# Patient Record
Sex: Male | Born: 1970 | Race: Black or African American | Hispanic: No | Marital: Single | Smoking: Never smoker
Health system: Southern US, Community
[De-identification: ages and names within clinical notes are randomized; demographics above are authoritative.]

## PROBLEM LIST (undated history)

## (undated) DIAGNOSIS — N200 Calculus of kidney: Secondary | ICD-10-CM

## (undated) DIAGNOSIS — N529 Male erectile dysfunction, unspecified: Secondary | ICD-10-CM

## (undated) DIAGNOSIS — Z9989 Dependence on other enabling machines and devices: Secondary | ICD-10-CM

## (undated) DIAGNOSIS — Z9109 Other allergy status, other than to drugs and biological substances: Secondary | ICD-10-CM

## (undated) DIAGNOSIS — G4733 Obstructive sleep apnea (adult) (pediatric): Secondary | ICD-10-CM

## (undated) DIAGNOSIS — I10 Essential (primary) hypertension: Secondary | ICD-10-CM

## (undated) HISTORY — DX: Obstructive sleep apnea (adult) (pediatric): G47.33

## (undated) HISTORY — DX: Male erectile dysfunction, unspecified: N52.9

## (undated) HISTORY — DX: Obstructive sleep apnea (adult) (pediatric): Z99.89

## (undated) HISTORY — DX: Other allergy status, other than to drugs and biological substances: Z91.09

## (undated) HISTORY — DX: Calculus of kidney: N20.0

---

## 1998-07-24 ENCOUNTER — Inpatient Hospital Stay: Admission: EM | Admit: 1998-07-24 | Discharge: 1998-07-25 | Payer: Self-pay | Admitting: Emergency Medicine

## 1999-05-24 ENCOUNTER — Emergency Department (HOSPITAL_COMMUNITY): Admission: EM | Admit: 1999-05-24 | Discharge: 1999-05-24 | Payer: Self-pay | Admitting: Emergency Medicine

## 2004-02-25 ENCOUNTER — Emergency Department (HOSPITAL_COMMUNITY): Admission: AD | Admit: 2004-02-25 | Discharge: 2004-02-25 | Payer: Self-pay | Admitting: Family Medicine

## 2005-09-07 ENCOUNTER — Emergency Department (HOSPITAL_COMMUNITY): Admission: EM | Admit: 2005-09-07 | Discharge: 2005-09-07 | Payer: Self-pay | Admitting: Family Medicine

## 2006-03-21 ENCOUNTER — Emergency Department (HOSPITAL_COMMUNITY): Admission: EM | Admit: 2006-03-21 | Discharge: 2006-03-21 | Payer: Self-pay | Admitting: Family Medicine

## 2006-04-13 ENCOUNTER — Encounter: Payer: Self-pay | Admitting: Emergency Medicine

## 2011-06-23 ENCOUNTER — Other Ambulatory Visit: Payer: Self-pay | Admitting: Family Medicine

## 2011-06-23 DIAGNOSIS — I10 Essential (primary) hypertension: Secondary | ICD-10-CM

## 2011-06-28 ENCOUNTER — Ambulatory Visit
Admission: RE | Admit: 2011-06-28 | Discharge: 2011-06-28 | Disposition: A | Discharge status: Home / Self Care | Payer: BC Managed Care – PPO | Source: Ambulatory Visit | Attending: Family Medicine | Admitting: Family Medicine

## 2011-06-28 DIAGNOSIS — I10 Essential (primary) hypertension: Secondary | ICD-10-CM

## 2011-06-28 MED ORDER — GADOBENATE DIMEGLUMINE 529 MG/ML IV SOLN
15.0000 mL | Freq: Once | INTRAVENOUS | Status: AC | PRN
  Administered 2011-06-28: 15 mL via INTRAVENOUS

## 2014-01-02 ENCOUNTER — Other Ambulatory Visit: Payer: Self-pay | Admitting: Family Medicine

## 2014-01-02 ENCOUNTER — Ambulatory Visit
Admission: RE | Admit: 2014-01-02 | Discharge: 2014-01-02 | Disposition: A | Discharge status: Home / Self Care | Payer: BC Managed Care – PPO | Source: Ambulatory Visit | Attending: Family Medicine | Admitting: Family Medicine

## 2014-01-02 DIAGNOSIS — R059 Cough, unspecified: Secondary | ICD-10-CM

## 2014-01-02 DIAGNOSIS — R05 Cough: Secondary | ICD-10-CM

## 2014-01-02 DIAGNOSIS — R509 Fever, unspecified: Secondary | ICD-10-CM

## 2014-01-02 DIAGNOSIS — J189 Pneumonia, unspecified organism: Secondary | ICD-10-CM

## 2014-04-18 ENCOUNTER — Encounter (HOSPITAL_COMMUNITY): Payer: Self-pay | Admitting: Emergency Medicine

## 2014-04-18 ENCOUNTER — Emergency Department (HOSPITAL_COMMUNITY)
Admission: EM | Admit: 2014-04-18 | Discharge: 2014-04-19 | Disposition: A | Discharge status: Home / Self Care | Payer: BC Managed Care – PPO | Attending: Emergency Medicine | Admitting: Emergency Medicine

## 2014-04-18 ENCOUNTER — Emergency Department (HOSPITAL_COMMUNITY): Payer: BC Managed Care – PPO

## 2014-04-18 DIAGNOSIS — N200 Calculus of kidney: Secondary | ICD-10-CM

## 2014-04-18 DIAGNOSIS — I10 Essential (primary) hypertension: Secondary | ICD-10-CM | POA: Insufficient documentation

## 2014-04-18 HISTORY — DX: Essential (primary) hypertension: I10

## 2014-04-18 LAB — CBC WITH DIFFERENTIAL/PLATELET
Basophils Absolute: 0 10*3/uL (ref 0.0–0.1)
Basophils Relative: 1 % (ref 0–1)
Eosinophils Absolute: 0.5 10*3/uL (ref 0.0–0.7)
Eosinophils Relative: 6 % — ABNORMAL HIGH (ref 0–5)
HEMATOCRIT: 43.5 % (ref 39.0–52.0)
Hemoglobin: 14.2 g/dL (ref 13.0–17.0)
LYMPHS PCT: 20 % (ref 12–46)
Lymphs Abs: 1.6 10*3/uL (ref 0.7–4.0)
MCH: 28.2 pg (ref 26.0–34.0)
MCHC: 32.6 g/dL (ref 30.0–36.0)
MCV: 86.3 fL (ref 78.0–100.0)
MONO ABS: 0.5 10*3/uL (ref 0.1–1.0)
MONOS PCT: 6 % (ref 3–12)
Neutro Abs: 5.6 10*3/uL (ref 1.7–7.7)
Neutrophils Relative %: 68 % (ref 43–77)
Platelets: 269 10*3/uL (ref 150–400)
RBC: 5.04 MIL/uL (ref 4.22–5.81)
RDW: 14.2 % (ref 11.5–15.5)
WBC: 8.3 10*3/uL (ref 4.0–10.5)

## 2014-04-18 LAB — COMPREHENSIVE METABOLIC PANEL
ALT: 31 U/L (ref 0–53)
AST: 27 U/L (ref 0–37)
Albumin: 4.1 g/dL (ref 3.5–5.2)
Alkaline Phosphatase: 63 U/L (ref 39–117)
BUN: 14 mg/dL (ref 6–23)
CO2: 28 meq/L (ref 19–32)
Calcium: 9.5 mg/dL (ref 8.4–10.5)
Chloride: 100 mEq/L (ref 96–112)
Creatinine, Ser: 1.35 mg/dL (ref 0.50–1.35)
GFR, EST AFRICAN AMERICAN: 73 mL/min — AB (ref >90)
GFR, EST NON AFRICAN AMERICAN: 63 mL/min — AB (ref >90)
GLUCOSE: 112 mg/dL — AB (ref 70–99)
Potassium: 3.5 mEq/L — ABNORMAL LOW (ref 3.7–5.3)
Sodium: 141 mEq/L (ref 137–147)
Total Bilirubin: 0.3 mg/dL (ref 0.3–1.2)
Total Protein: 7.4 g/dL (ref 6.0–8.3)

## 2014-04-18 LAB — URINALYSIS, ROUTINE W REFLEX MICROSCOPIC
BILIRUBIN URINE: NEGATIVE
Glucose, UA: NEGATIVE mg/dL
Hgb urine dipstick: NEGATIVE
Ketones, ur: NEGATIVE mg/dL
Leukocytes, UA: NEGATIVE
NITRITE: NEGATIVE
Protein, ur: NEGATIVE mg/dL
Specific Gravity, Urine: 1.013 (ref 1.005–1.030)
UROBILINOGEN UA: 0.2 mg/dL (ref 0.0–1.0)
pH: 7 (ref 5.0–8.0)

## 2014-04-18 MED ORDER — OXYCODONE-ACETAMINOPHEN 5-325 MG PO TABS: 1.0000 | ORAL_TABLET | ORAL | Status: DC | PRN

## 2014-04-18 MED ORDER — HYDROMORPHONE HCL PF 1 MG/ML IJ SOLN
0.5000 mg | Freq: Once | INTRAMUSCULAR | Status: AC
  Administered 2014-04-18: 0.5 mg via INTRAVENOUS
  Filled 2014-04-18: qty 1

## 2014-04-18 MED ORDER — ONDANSETRON 8 MG PO TBDP: 8.0000 mg | ORAL_TABLET | Freq: Three times a day (TID) | ORAL | Status: DC | PRN

## 2014-04-18 MED ORDER — ONDANSETRON HCL 4 MG/2ML IJ SOLN
4.0000 mg | Freq: Once | INTRAMUSCULAR | Status: AC
  Administered 2014-04-18: 4 mg via INTRAVENOUS
  Filled 2014-04-18: qty 2

## 2014-04-18 MED ORDER — TAMSULOSIN HCL 0.4 MG PO CAPS
0.4000 mg | ORAL_CAPSULE | Freq: Once | ORAL | Status: AC
  Administered 2014-04-18: 0.4 mg via ORAL
  Filled 2014-04-18: qty 1

## 2014-04-18 MED ORDER — TAMSULOSIN HCL 0.4 MG PO CAPS: 0.4000 mg | ORAL_CAPSULE | Freq: Every day | ORAL | Status: DC

## 2014-04-18 MED ORDER — HYDROMORPHONE HCL PF 1 MG/ML IJ SOLN
0.5000 mg | Freq: Once | INTRAMUSCULAR | Status: AC
Start: 2014-04-18 — End: 2014-04-18
  Administered 2014-04-18: 0.5 mg via INTRAVENOUS
  Filled 2014-04-18: qty 1

## 2014-04-18 MED ORDER — OXYCODONE-ACETAMINOPHEN 5-325 MG PO TABS
1.0000 | ORAL_TABLET | Freq: Once | ORAL | Status: AC
  Administered 2014-04-18: 1 via ORAL
  Filled 2014-04-18: qty 1

## 2014-04-18 MED ORDER — KETOROLAC TROMETHAMINE 30 MG/ML IJ SOLN
30.0000 mg | Freq: Once | INTRAMUSCULAR | Status: AC
  Administered 2014-04-18: 30 mg via INTRAVENOUS
  Filled 2014-04-18: qty 1

## 2014-04-18 MED ORDER — IBUPROFEN 800 MG PO TABS: 800.0000 mg | ORAL_TABLET | Freq: Three times a day (TID) | ORAL | Status: AC

## 2014-04-18 NOTE — ED Notes (Signed)
Pt presented with left side flank pain that started about 6pm tonight.

## 2014-04-18 NOTE — ED Notes (Signed)
Pt went to bathroom and then had pain in left flank after urination. No difficulty urinating.

## 2014-04-18 NOTE — ED Provider Notes (Signed)
CSN: [id number]     Arrival date & time 04/18/14  1838 History   First MD Initiated Contact with Patient 04/18/14 2103     Chief Complaint  Patient presents with  . Flank Pain    left     (Consider location/radiation/quality/duration/timing/severity/associated sxs/prior Treatment) HPI Margaree Mackintoshhillip C Brendle is a 43 y.o. male who presents to ED with sudden onset of left flank pain. Pt states he urinated and about 20 min after started having sharp and stabbing flank pain that radiates into the left lower abdomen. Denies any urinary symptoms since. No changes in bowels. Admits to nausea and vomiting. States having sweats. Patient reports history of similar pain in the past, was diagnosed with kidney stones, states last kidney stone was over 20 years ago. He denies any fever. He denies any blood in his stool or emesis. He did not take any medications prior to coming in. Nothing makes pain better or worse.  Past Medical History  Diagnosis Date  . Hypertension    History reviewed. No pertinent past surgical history. No family history on file. History  Substance Use Topics  . Smoking status: Never Smoker   . Smokeless tobacco: Not on file  . Alcohol Use: No    Review of Systems  Constitutional: Negative for fever and chills.  Respiratory: Negative for cough, chest tightness and shortness of breath.   Cardiovascular: Negative for chest pain, palpitations and leg swelling.  Gastrointestinal: Positive for abdominal pain. Negative for nausea, vomiting, diarrhea and abdominal distention.  Genitourinary: Positive for flank pain. Negative for dysuria, urgency, frequency, hematuria, discharge, penile swelling, scrotal swelling and penile pain.  Musculoskeletal: Negative for arthralgias, myalgias, neck pain and neck stiffness.  Skin: Negative for rash.  Allergic/Immunologic: Negative for immunocompromised state.  Neurological: Negative for dizziness, weakness, light-headedness, numbness and headaches.       Allergies  Review of patient's allergies indicates no known allergies.  Home Medications   Prior to Admission medications   Not on File   BP 133/87  Pulse 72  Temp(Src) 98 F (36.7 C) (Oral)  Resp 21  SpO2 99% Physical Exam  Nursing note and vitals reviewed. Constitutional: He appears well-developed and well-nourished.  Appears to be in pain  HENT:  Head: Normocephalic and atraumatic.  Eyes: Conjunctivae are normal.  Neck: Neck supple.  Cardiovascular: Normal rate, regular rhythm and normal heart sounds.   Pulmonary/Chest: Effort normal. No respiratory distress. He has no wheezes. He has no rales.  Abdominal: Soft. Bowel sounds are normal. He exhibits no distension. There is no tenderness. There is no rebound and no guarding.  No CVA tenderness  Musculoskeletal: He exhibits no edema.  Neurological: He is alert.  Skin: Skin is warm and dry.    ED Course  Procedures (including critical care time) Labs Review Labs Reviewed  CBC WITH DIFFERENTIAL - Abnormal; Notable for the following:    Eosinophils Relative 6 (*)    All other components within normal limits  COMPREHENSIVE METABOLIC PANEL - Abnormal; Notable for the following:    Potassium 3.5 (*)    Glucose, Bld 112 (*)    GFR calc non Af Amer 63 (*)    GFR calc Af Amer 73 (*)    All other components within normal limits  URINALYSIS, ROUTINE W REFLEX MICROSCOPIC - Abnormal; Notable for the following:    APPearance CLOUDY (*)    All other components within normal limits    Imaging Review Ct Abdomen Pelvis Wo Contrast  04/18/2014  CLINICAL DATA:  Left flank pain after urination. Evaluate for renal stone.  EXAM: CT ABDOMEN AND PELVIS WITHOUT CONTRAST  TECHNIQUE: Multidetector CT imaging of the abdomen and pelvis was performed following the standard protocol without IV contrast.  COMPARISON:  None currently available  FINDINGS: BODY WALL: Unremarkable.  LOWER CHEST: Unremarkable.  ABDOMEN/PELVIS:  Liver: 2 sub cm  low densities in the right hepatic lobe are stable from 2012 MRI and considered benign.  Biliary: No evidence of biliary obstruction or stone.  Pancreas: Unremarkable.  Spleen: Unremarkable.  Adrenals: Unremarkable.  Kidneys and ureters: 4 mm stone at the left ureteral vesicular junction. Mild left hydronephrosis and perinephric stranding. No intrarenal left calculus. There is a punctate stone in the interpolar right kidney.  Bladder: Unremarkable.  Reproductive: Unremarkable.  Bowel: No obstruction. Normal appendix.  Retroperitoneum: No mass or adenopathy.  Peritoneum: No free fluid or gas.  Vascular: No acute abnormality.  OSSEOUS: No acute abnormalities. 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 There is degenerative disc change, most notable at L4-5 and L5-S1.  IMPRESSION: 1. 4 mm stone in the distal left ureter.  Mild left hydronephrosis. 2. Punctate right nephrolithiasis. 3. 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   Electronically Signed   By: Tiburcio PeaJonathan  Watts M.D.   On: 04/18/2014 22:08     EKG Interpretation None      MDM   Final diagnoses:  Kidney stone    Patient with sudden onset of left flank pain. Pain radiates into the left abdomen. Examined history concerning for a kidney stone. He has no abdominal tenderness or CVA tenderness on exam. He's afebrile. Labs and urinalysis pending. Will get CT abdomen pelvis for a stone study.  11:37 PM Pt's CT showing left 4mm stone in distal ureter. Feeling much better. Pain is 0/10. Home with pain medications - percocet, flomax, ibuprofen. Return if worsening. Follow up with urology. Plan discussed with pt and his wife, agree.   Filed Vitals:   04/18/14 1853 04/18/14 2239 04/18/14 2336 04/19/14 0023  BP: 133/87 107/63 129/79   Pulse: 72 71 80   Temp: 98 F (36.7 C)   98.3 F (36.8 C)  TempSrc: Oral   Oral  Resp: 21  16   SpO2: 99% 99% 93% 95%     Lottie Musselatyana A Brandolyn Shortridge, PA-C 04/19/14 0039

## 2014-04-18 NOTE — Discharge Instructions (Signed)
You have a left kidney stone. Start taking flomax daily. Take zofran as prescribed as needed for nausea. Take percocet as prescribed as needed for pain. Ibuprofen in addition for pain relief. Follow up with primary care doctor and urology.    Kidney Stones Kidney stones (urolithiasis) are deposits that form inside your kidneys. The intense pain is caused by the stone moving through the urinary tract. When the stone moves, the ureter goes into spasm around the stone. The stone is usually passed in the urine.  CAUSES   A disorder that makes certain neck glands produce too much parathyroid hormone (primary hyperparathyroidism).  A buildup of uric acid crystals, similar to gout in your joints.  Narrowing (stricture) of the ureter.  A kidney obstruction present at birth (congenital obstruction).  Previous surgery on the kidney or ureters.  Numerous kidney infections. SYMPTOMS   Feeling sick to your stomach (nauseous).  Throwing up (vomiting).  Blood in the urine (hematuria).  Pain that usually spreads (radiates) to the groin.  Frequency or urgency of urination. DIAGNOSIS   Taking a history and physical exam.  Blood or urine tests.  CT scan.  Occasionally, an examination of the inside of the urinary bladder (cystoscopy) is performed. TREATMENT   Observation.  Increasing your fluid intake.  Extracorporeal shock wave lithotripsy This is a noninvasive procedure that uses shock waves to break up kidney stones.  Surgery may be needed if you have severe pain or persistent obstruction. There are various surgical procedures. Most of the procedures are performed with the use of small instruments. Only small incisions are needed to accommodate these instruments, so recovery time is minimized. The size, location, and chemical composition are all important variables that will determine the proper choice of action for you. Talk to your health care provider to better understand your  situation so that you will minimize the risk of injury to yourself and your kidney.  HOME CARE INSTRUCTIONS   Drink enough water and fluids to keep your urine clear or pale yellow. This will help you to pass the stone or stone fragments.  Strain all urine through the provided strainer. Keep all particulate matter and stones for your health care provider to see. The stone causing the pain may be as small as a grain of salt. It is very important to use the strainer each and every time you pass your urine. The collection of your stone will allow your health care provider to analyze it and verify that a stone has actually passed. The stone analysis will often identify what you can do to reduce the incidence of recurrences.  Only take over-the-counter or prescription medicines for pain, discomfort, or fever as directed by your health care provider.  Make a follow-up appointment with your health care provider as directed.  Get follow-up X-rays if required. The absence of pain does not always mean that the stone has passed. It may have only stopped moving. If the urine remains completely obstructed, it can cause loss of kidney function or even complete destruction of the kidney. It is your responsibility to make sure X-rays and follow-ups are completed. Ultrasounds of the kidney can show blockages and the status of the kidney. Ultrasounds are not associated with any radiation and can be performed easily in a matter of minutes. SEEK MEDICAL CARE IF:  You experience pain that is progressive and unresponsive to any pain medicine you have been prescribed. SEEK IMMEDIATE MEDICAL CARE IF:   Pain cannot be controlled with the  prescribed medicine.  You have a fever or shaking chills.  The severity or intensity of pain increases over 18 hours and is not relieved by pain medicine.  You develop a new onset of abdominal pain.  You feel faint or pass out.  You are unable to urinate. MAKE SURE YOU:    Understand these instructions.  Will watch your condition.  Will get help right away if you are not doing well or get worse. Document Released: 11/29/2005 Document Revised: 08/01/2013 Document Reviewed: 05/02/2013 [facility] Patient Information 2014 [place], [place].  Diet for Kidney Stones Kidney stones are small, hard masses that form inside your kidneys. They are made up of salts and minerals and often form when high levels build up in the urine. The minerals can then start to build up, crystalize, and stick together to form stones. There are several different types of kidney stones. The following types of stones may be influenced by dietary factors:   Calcium Oxalate Stones. An oxalate is a salt found in certain foods. Within the body, calcium can combine with oxalates to form calcium oxalate stones, which can be excreted in the urine in high amounts. This is the most common type of kidney stone.  Calcium Phosphate Stones. These stones may occur when the pH of the urine becomes too high, or less acidic, from too much calcium being excreted in the urine. The pH is a measure of how acidic or basic a substance is.  Uric Acid Stones. This type of stone occurs when the pH of the urine becomes too low, or very acidic, because substances called purines build up in the urine. Purines are found in animal proteins. When the urine is highly concentrated with acid, uric acid kidney stones can form.  Other risk factors for kidney stones include genetics, environment, and being overweight. Your caregiver may ask you to follow specific diet guidelines based on the type of stone you have to lessen the chances of your body making more kidney stones.  GENERAL GUIDELINES FOR ALL TYPES OF STONES  Drink plenty of fluid. Drink 12 16 cups of fluid a day, drinking mainly water.This is the most important thing you can do to prevent the formation of future kidney stones.  Maintain a healthy weight. Your caregiver  or dietitian can help you determine what a healthy weight is for you. If you are overweight, weight loss may help prevent the formation of future kidney stones.  Eat a diet adequate in animal protein. Too much animal protein can contribute to the formation of stones. Your dietitian can help you determine how much protein you should be eating. Avoid low carbohydrate, high protein diets.  Follow a balanced eating approach. The DASH diet, which stands for "Dietary Approaches to Stop Hypertension," is an effective meal plan for reducing stone formation. This diet is high in fruits, vegetables, dairy, and whole grains and low in animal protein. Ask your caregiver or dietitian for information about the DASH diet. ADDITIONAL DIET GUIDELINES FOR CALCIUM STONES Avoid foods high in salt. This includes table salt, salt seasonings, MSG, soy sauce, cured and processed meats, salted crackers and snack foods, fast food, and canned soups and foods. Ask your caregiver or dietitian for information about reducing sodium in your diet or following the low sodium diet.  Ensure adequate calcium intake. Use the following table for calcium guidelines:  Men 42 years old and younger  1000 mg/day.  Men 42 years old and older  1500 mg/day.  Women 25  42 years old  1000 mg/day.  Women 50 years and older  1500 mg/day. Your dietitian can help you determine if you are getting enough calcium in your diet. Foods that are high in calcium include dairy products, broccoli, cheese, yogurt, and pudding. If you need to take a calcium supplement, take it only in the form of calcium citrate.  Avoid foods high in oxalate. Be sure that any supplements you take do not contain more than 500 mg of vitamin C. Vitamin C is converted into oxalate in the body. You do not need to avoid fruits and vegetables high in vitamin C.   Grains: High-fiber or bran cereal, whole-wheat bread, grits, barley, buckwheat, amaranth, pretzels, and  fruitcake.  Vegetables: Dried beans, wax beans, dark leafy greens, eggplant, leeks, okra, parsley, rutabaga, tomato paste, watercress, zucchini, and escarole.  Fruit: Dried apricots, red currants, figs, kiwi, and rhubarb.  Meat and Meat Substitutes: Soybeans and foods made from soy (soyburger, miso), dried beans, peanut butter.  Milk: Chocolate milk mixes and soymilk.  Fats and Oils: Nuts (peanuts, almonds, pecans, cashews, hazelnuts) and nut butters, sesame seeds, and tDahini paste.  Condiments/Miscellaneous: Chocolate, carob, marmalade, poppy seeds, instant iced tea, and juice from high-oxalate fruits.  Document Released: 03/26/2011 Document Revised: 05/30/2012 Document Reviewed: 05/15/2012 [facility] Patient Information 2014 [place], [place].

## 2014-04-20 NOTE — ED Provider Notes (Signed)
Medical screening examination/treatment/procedure(s) were performed by non-physician practitioner and as supervising physician I was immediately available for consultation/collaboration.  Jennene Downie L Bucky Grigg, MD 04/20/14 1509 

## 2015-03-23 IMAGING — CT CT ABD-PELV W/O CM
1 series · 15 of 30 positions shown, 19 images · non-contrast
Comparison: None currently available

CLINICAL DATA: Left flank pain after urination. Evaluate for renal
stone.

EXAM:
CT ABDOMEN AND PELVIS WITHOUT CONTRAST
TECHNIQUE: Multidetector CT imaging of the abdomen and pelvis was performed
following the standard protocol without IV contrast.

[Series 6: lung · axial · 0.74mm/px · z∈[+1075,+1205]mm · 15 of 30 slices shown, 19 images]
[im 3/30  soft-tissue]
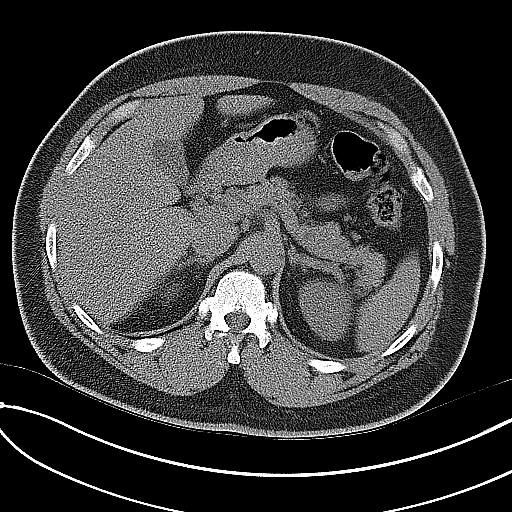
[im 3/30  bone]
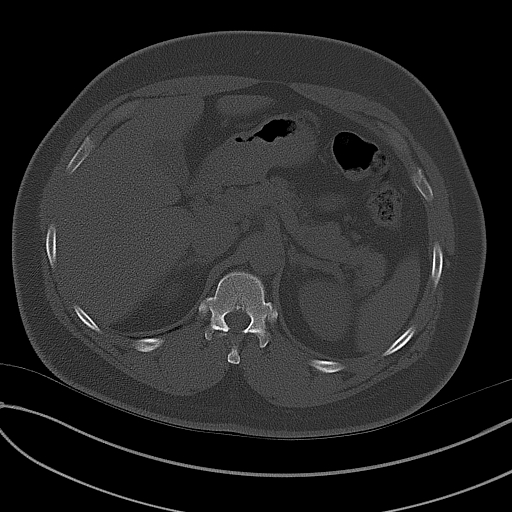
[im 5/30  soft-tissue]
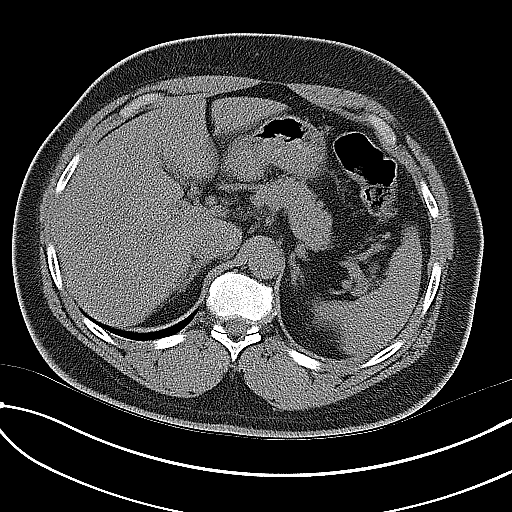
[im 7/30  soft-tissue]
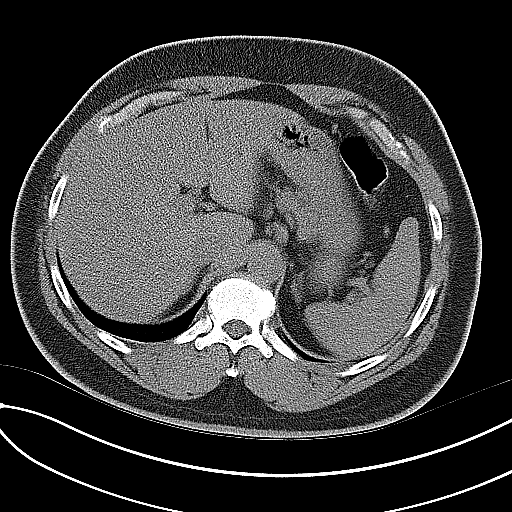
[im 9/30  soft-tissue]
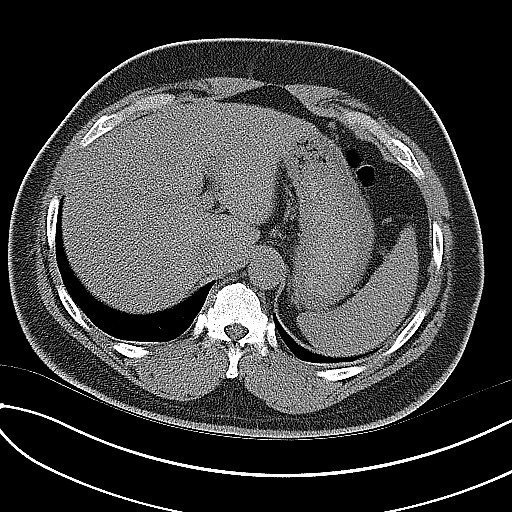
[im 11/30  soft-tissue]
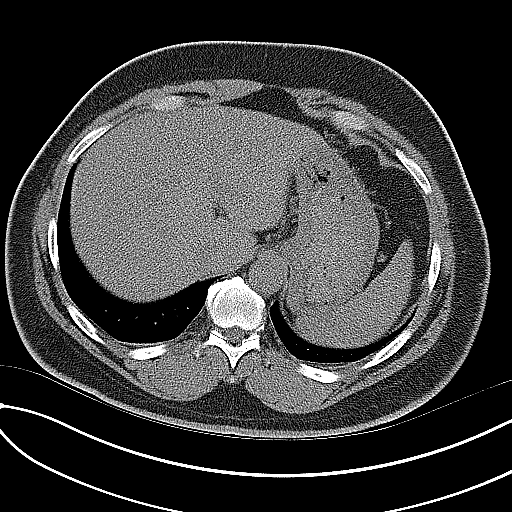
[im 13/30  soft-tissue]
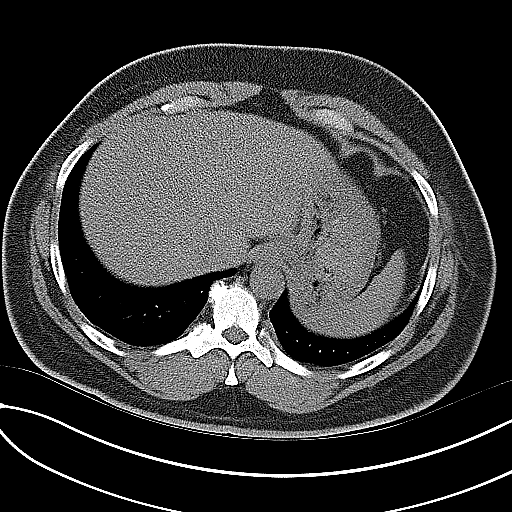
[im 16/30  soft-tissue]
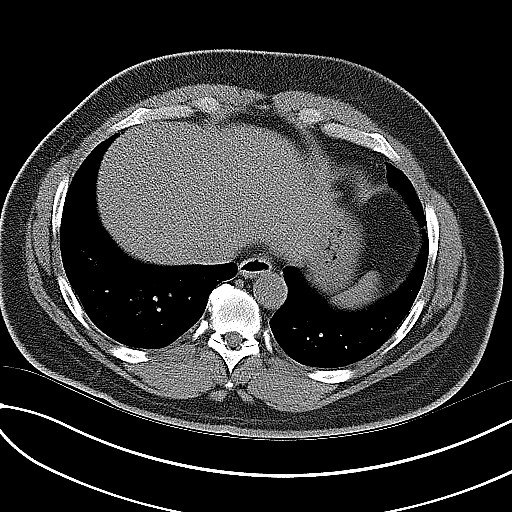
[im 18/30  soft-tissue]
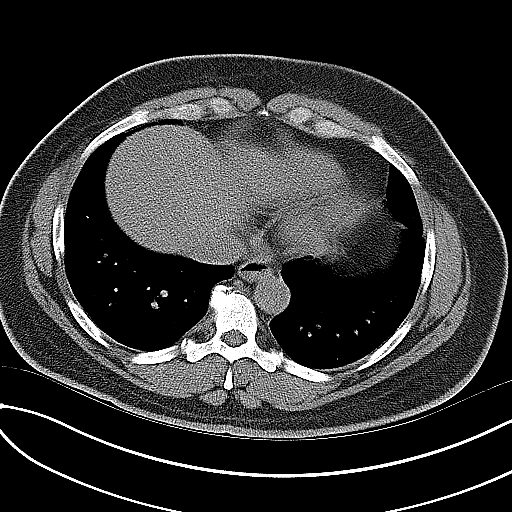
[im 20/30  soft-tissue]
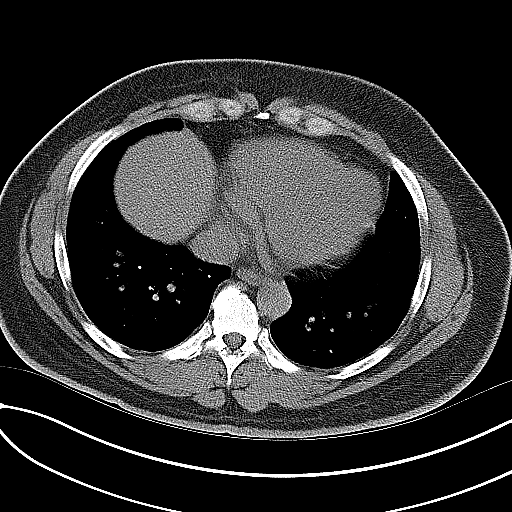
[im 20/30  bone]
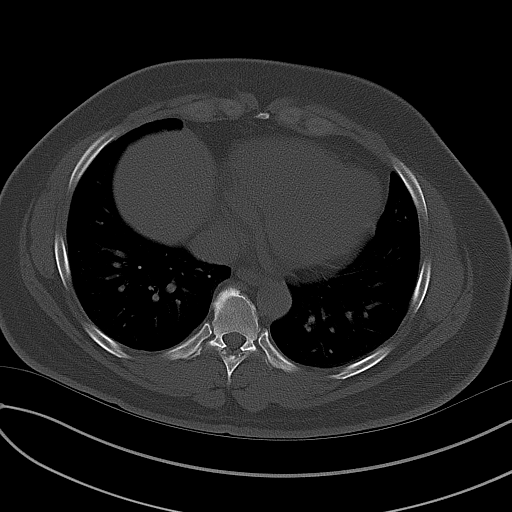
[im 22/30  soft-tissue]
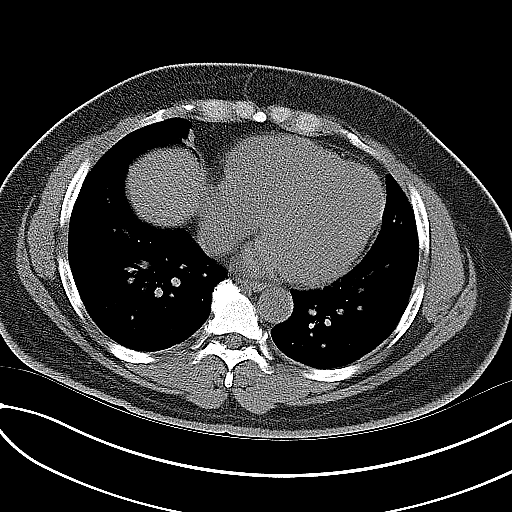
[im 24/30  soft-tissue]
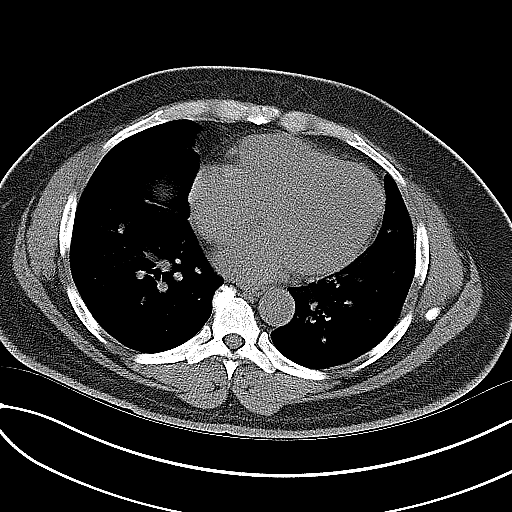
[im 26/30  soft-tissue]
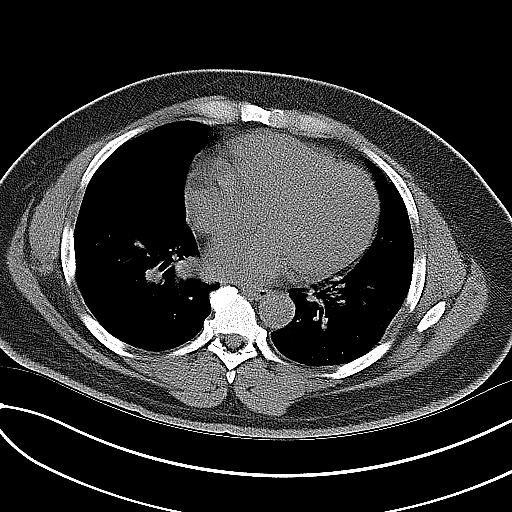
[im 26/30  lung]
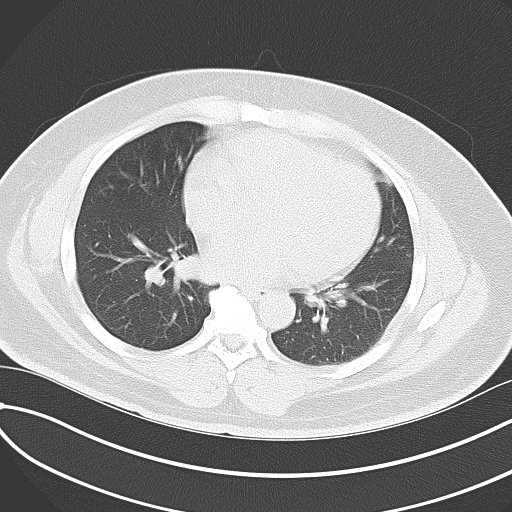
[im 27/30  lung]
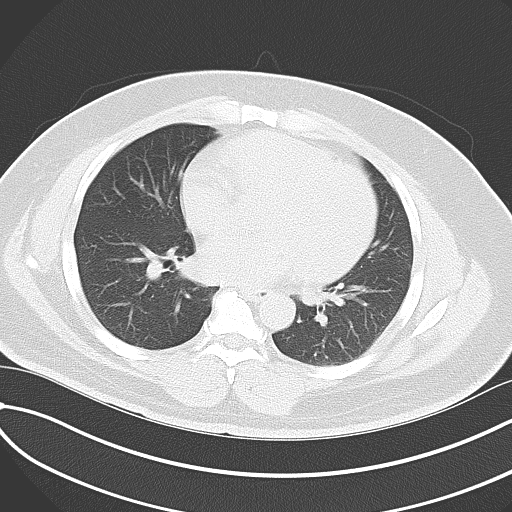
[im 28/30  soft-tissue]
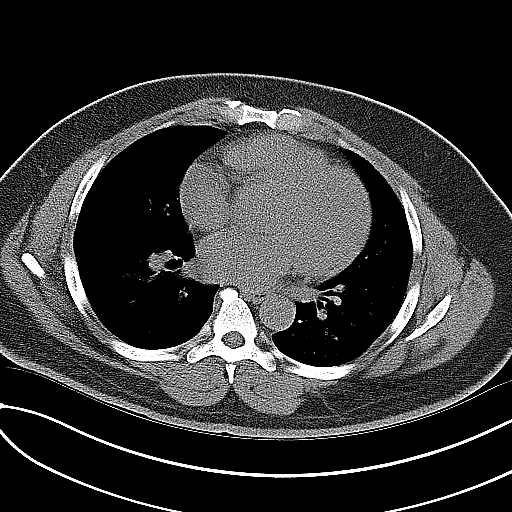
[im 28/30  lung]
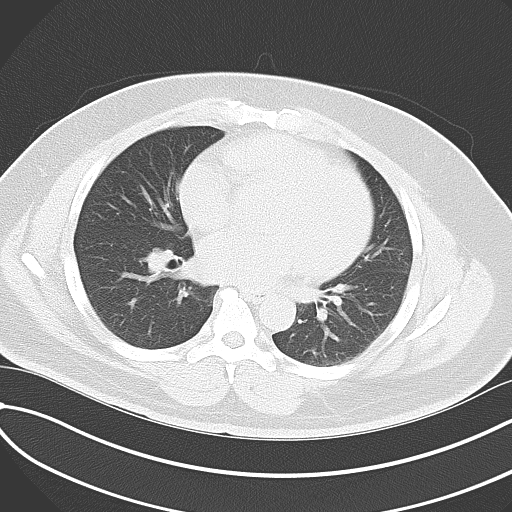
[im 29/30  lung]
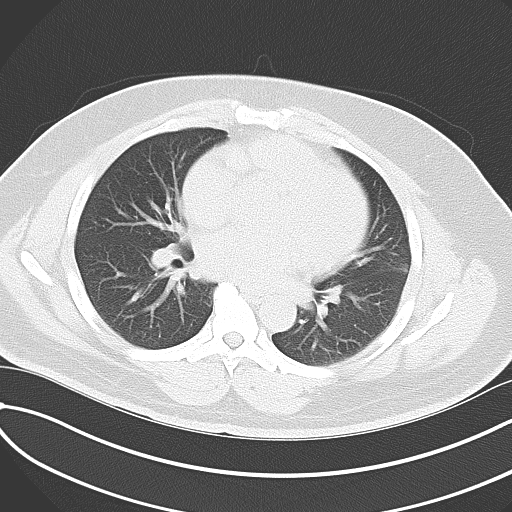

[15 of 30 positions shown; findings below may reference images not displayed]

FINDINGS: BODY WALL: Unremarkable.

LOWER CHEST: Unremarkable.

ABDOMEN/PELVIS:

Liver: 2 sub cm low densities in the right hepatic lobe are stable
from 2272 MRI and considered benign.

Biliary: No evidence of biliary obstruction or stone.

Pancreas: Unremarkable.

Spleen: Unremarkable.

Adrenals: Unremarkable.

Kidneys and ureters: 4 mm stone at the left ureteral vesicular
junction. Mild left hydronephrosis and perinephric stranding. No
intrarenal left calculus. There is a punctate stone in the
interpolar right kidney.

Bladder: Unremarkable.

Reproductive: Unremarkable.

Bowel: No obstruction. Normal appendix.

Retroperitoneum: No mass or adenopathy.

Peritoneum: No free fluid or gas.

Vascular: No acute abnormality.

OSSEOUS: No acute abnormalities. Bilateral L4 pars defects without
slip. There is degenerative disc change, most notable at L4-5 and
L5-S1.
IMPRESSION: 1. 4 mm stone in the distal left ureter.  Mild left hydronephrosis.
2. Punctate right nephrolithiasis.
3. L4 pars defects without listhesis.

## 2016-10-06 DIAGNOSIS — Z0000 Encounter for general adult medical examination without abnormal findings: Secondary | ICD-10-CM | POA: Diagnosis not present

## 2016-10-06 DIAGNOSIS — N529 Male erectile dysfunction, unspecified: Secondary | ICD-10-CM | POA: Diagnosis not present

## 2016-10-06 DIAGNOSIS — I10 Essential (primary) hypertension: Secondary | ICD-10-CM | POA: Diagnosis not present

## 2016-10-06 DIAGNOSIS — Z23 Encounter for immunization: Secondary | ICD-10-CM | POA: Diagnosis not present

## 2016-10-06 DIAGNOSIS — G4733 Obstructive sleep apnea (adult) (pediatric): Secondary | ICD-10-CM | POA: Diagnosis not present

## 2016-10-06 DIAGNOSIS — Z125 Encounter for screening for malignant neoplasm of prostate: Secondary | ICD-10-CM | POA: Diagnosis not present

## 2017-11-15 DIAGNOSIS — I10 Essential (primary) hypertension: Secondary | ICD-10-CM | POA: Diagnosis not present

## 2017-11-15 DIAGNOSIS — G4733 Obstructive sleep apnea (adult) (pediatric): Secondary | ICD-10-CM | POA: Diagnosis not present

## 2017-11-15 DIAGNOSIS — N529 Male erectile dysfunction, unspecified: Secondary | ICD-10-CM | POA: Diagnosis not present

## 2017-11-15 DIAGNOSIS — Z23 Encounter for immunization: Secondary | ICD-10-CM | POA: Diagnosis not present

## 2017-11-15 DIAGNOSIS — Z0000 Encounter for general adult medical examination without abnormal findings: Secondary | ICD-10-CM | POA: Diagnosis not present

## 2017-12-09 DIAGNOSIS — E876 Hypokalemia: Secondary | ICD-10-CM | POA: Diagnosis not present

## 2018-01-13 DIAGNOSIS — I10 Essential (primary) hypertension: Secondary | ICD-10-CM | POA: Diagnosis not present

## 2018-04-11 DIAGNOSIS — G4733 Obstructive sleep apnea (adult) (pediatric): Secondary | ICD-10-CM | POA: Diagnosis not present

## 2018-04-11 DIAGNOSIS — I10 Essential (primary) hypertension: Secondary | ICD-10-CM | POA: Diagnosis not present

## 2018-06-30 ENCOUNTER — Ambulatory Visit: Payer: BLUE CROSS/BLUE SHIELD | Admitting: Cardiology

## 2018-06-30 VITALS — BP 138/82 | HR 69 | Ht 67.0 in | Wt 247.0 lb

## 2018-06-30 DIAGNOSIS — I10 Essential (primary) hypertension: Secondary | ICD-10-CM | POA: Diagnosis not present

## 2018-06-30 DIAGNOSIS — G4733 Obstructive sleep apnea (adult) (pediatric): Secondary | ICD-10-CM

## 2018-06-30 MED ORDER — IRBESARTAN 300 MG PO TABS: 300.0000 mg | ORAL_TABLET | Freq: Every day | ORAL | 3 refills | Status: AC

## 2018-06-30 MED ORDER — NEBIVOLOL HCL 5 MG PO TABS: 5.0000 mg | ORAL_TABLET | Freq: Every day | ORAL | 11 refills | Status: DC

## 2018-06-30 NOTE — Progress Notes (Signed)
Cardiology Office Note:    Date:  06/30/2018   ID:  Willie Contreras, DOB 1971/08/14, MRN [id number]  PCP:  Blair HeysEhinger, Robert, MD  Cardiologist:  No primary care provider on file.   Referring MD: Blair HeysEhinger, Robert, MD     History of Present Illness:    Willie Contreras is a 47 y.o. male here for evaluation of uncontrolled hypertension at the request of Dr. Manus GunningEhinger.  He had MRA of his renal arteries back in July 2012 which was negative for renal artery stenosis when his hypertension was previously first dealt with.  Blood pressure is not controlled with amlodipine 10 mg a day, irbesartan 300 mg a day.  He has had side effects to lisinopril-cough, atenolol-fatigue, triamterene hydrochlorothiazide- "hot and tired ", spironolactone-ED.  Some blood pressures at work have been 158/100.  He is trying to exercise and watch his diet but his weight still remains elevated with a BMI of 36.  Overall he is asymptomatic no shortness of breath, no chest pain, no fevers, no chills, no bleeding, no syncope, no headaches.  His wife takes his blood pressure with a large cuff with stethoscope.      Past Medical History:  Diagnosis Date  . ED (erectile dysfunction)   . Environmental allergies   . Hypertension   . Kidney stones   . OSA on CPAP     History reviewed. No pertinent surgical history.  Current Medications: Current Meds  Medication Sig  . amLODipine (NORVASC) 10 MG tablet Take 10 mg by mouth every morning.  . cetirizine (ZYRTEC) 10 MG tablet Take 10 mg by mouth daily as needed for allergies.  . fexofenadine (ALLEGRA) 180 MG tablet Take 180 mg by mouth daily as needed for allergies or rhinitis.  Marland Kitchen. ibuprofen (ADVIL,MOTRIN) 800 MG tablet Take 1 tablet (800 mg total) by mouth 3 (three) times daily.  . irbesartan (AVAPRO) 300 MG tablet Take 1 tablet (300 mg total) by mouth daily.  . [DISCONTINUED] irbesartan (AVAPRO) 300 MG tablet Take 300 mg by mouth daily.     Allergies:   Atenolol;  Lisinopril; and Triamterene   Social History   Socioeconomic History  . Marital status: Single    Spouse name: Not on file  . Number of children: Not on file  . Years of education: Not on file  . Highest education level: Not on file  Occupational History  . Not on file  Social Needs  . Financial resource strain: Not on file  . Food insecurity:    Worry: Not on file    Inability: Not on file  . Transportation needs:    Medical: Not on file    Non-medical: Not on file  Tobacco Use  . Smoking status: Never Smoker  . Smokeless tobacco: Never Used  Substance and Sexual Activity  . Alcohol use: No  . Drug use: Not on file  . Sexual activity: Not on file    Comment: married  Lifestyle  . Physical activity:    Days per week: Not on file    Minutes per session: Not on file  . Stress: Not on file  Relationships  . Social connections:    Talks on phone: Not on file    Gets together: Not on file    Attends religious service: Not on file    Active member of club or organization: Not on file    Attends meetings of clubs or organizations: Not on file    Relationship status: Not on  file  Other Topics Concern  . Not on file  Social History Narrative  . Not on file     Family History: Mother died in her late 66s from gastric cancer.  ROS:   Please see the history of present illness.     All other systems reviewed and are negative.  EKGs/Labs/Other Studies Reviewed:    The following studies were reviewed today: Lab work, EKGs, prior office notes reviewed  EKG:  EKG is ordered today.  06/30/2018-sinus rhythm 69, borderline LVH personally viewed  Recent Labs: No results found for requested labs within last 8760 hours.  Recent Lipid Panel No results found for: CHOL, TRIG, HDL, CHOLHDL, VLDL, LDLCALC, LDLDIRECT  Physical Exam:    VS:  BP 138/82   Pulse 69   Ht 5\' 7"  (1.702 m)   Wt 247 lb (112 kg)   SpO2 97%   BMI 38.69 kg/m     Wt Readings from Last 3 Encounters:    06/30/18 247 lb (112 kg)     GEN:  Well nourished, well developed in no acute distress HEENT: Normal NECK: No JVD; No carotid bruits LYMPHATICS: No lymphadenopathy CARDIAC: RRR, no murmurs, rubs, gallops RESPIRATORY:  Clear to auscultation without rales, wheezing or rhonchi  ABDOMEN: Soft, non-tender, non-distended MUSCULOSKELETAL:  No edema; No deformity  SKIN: Warm and dry NEUROLOGIC:  Alert and oriented x 3 PSYCHIATRIC:  Normal affect   ASSESSMENT:    1. Essential hypertension   2. OSA (obstructive sleep apnea)    PLAN:    In order of problems listed above:  Uncontrolled hypertension - We will try Bystolic 5 mg once a day.  We will give him samples.  This will need to be preauthorized.  He has failed beta-blocker previously, atenolol.  He has been on multiple medications without success.  Continue with his atenolol and irbesartan.  Blood in the past with diuretic, chlorthalidone with hypokalemia.  He is also had trouble with spironolactone-ED. he is fairly certain that he has had a TSH checked in the past.  Obstructive sleep apnea -continue with CPAP.  I will have him check his blood pressures, please relay readings in 1 month.  I will also have him follow-up with the hypertension clinic in 2 months.  Medication Adjustments/Labs and Tests Ordered: Current medicines are reviewed at length with the patient today.  Concerns regarding medicines are outlined above.  Orders Placed This Encounter  Procedures  . EKG 12-Lead   Meds ordered this encounter  Medications  . nebivolol (BYSTOLIC) 5 MG tablet    Sig: Take 1 tablet (5 mg total) by mouth daily.    Dispense:  30 tablet    Refill:  11  . irbesartan (AVAPRO) 300 MG tablet    Sig: Take 1 tablet (300 mg total) by mouth daily.    Dispense:  90 tablet    Refill:  3    Patient Instructions  Medication Instructions:  Start Bystolic 5 mg a day. Continue other medications as listed.  Follow-Up: Follow up in 2 months  in the Hypertension Clinic.  Please keep a check of your blood pressures and notify Dr Anne Fu of them in 1 month.  If you need a refill on your cardiac medications before your next appointment, please call your pharmacy.  Thank you for choosing [facility]!!        Signed, Donato Schultz, MD  06/30/2018 3:12 PM    [facility]

## 2018-06-30 NOTE — Patient Instructions (Signed)
Medication Instructions:  Start Bystolic 5 mg a day. Continue other medications as listed.  Follow-Up: Follow up in 2 months in the Hypertension Clinic.  Please keep a check of your blood pressures and notify Dr Anne FuSkains of them in 1 month.  If you need a refill on your cardiac medications before your next appointment, please call your pharmacy.  Thank you for choosing [facility]!!

## 2018-07-07 ENCOUNTER — Other Ambulatory Visit: Payer: Self-pay

## 2018-07-07 ENCOUNTER — Telehealth: Payer: Self-pay

## 2018-07-07 MED ORDER — NEBIVOLOL HCL 5 MG PO TABS: 5.0000 mg | ORAL_TABLET | Freq: Every day | ORAL | 11 refills | Status: AC

## 2018-07-07 NOTE — Telephone Encounter (Signed)
**Note De-Identified Kaliegh Willadsen Obfuscation** I have done a Bystolic PA through covermymeds. Key: [id number]

## 2018-08-31 ENCOUNTER — Ambulatory Visit: Payer: BLUE CROSS/BLUE SHIELD

## 2018-09-19 ENCOUNTER — Ambulatory Visit: Payer: BLUE CROSS/BLUE SHIELD | Admitting: Pharmacist

## 2018-09-19 NOTE — Progress Notes (Deleted)
Patient ID: Willie Contreras                 DOB: 11/05/1971                      MRN: [id number]     HPI: Willie Contreras is a 47 y.o. male referred by Dr. Anne Fu to HTN clinic. PMH is significant for uncontrolled HTN, obesity, ED, and OSA on CPAP. He was referred by PCP to cardiology for BP management and was started on Bystolic 5mg  daily at office visit in July 2019.   Can increase bystolic Using ibuprofen? Has high dose TID listed Try eplerenone BMET stable  Current HTN meds: amlodipine 10mg  daily, irbesartan 300mg  daily, Bystolic 58m daily  Previously tried: lisinopril - cough, atenolol - fatigue, triamterene-HCTZ - "hot and tired," spironolactone - ED, chlorthalidone - hypokalemia  BP goal: <130/17mmHg  Family History: Non contributory.  Social History: Denies tobacco use, alcohol, and illicit drug use.  Diet:   Exercise:   Home BP readings:   Wt Readings from Last 3 Encounters:  06/30/18 247 lb (112 kg)   BP Readings from Last 3 Encounters:  06/30/18 138/82  04/18/14 129/79   Pulse Readings from Last 3 Encounters:  06/30/18 69  04/18/14 80    Renal function: CrCl cannot be calculated (Patient's most recent lab result is older than the maximum 21 days allowed.).  Past Medical History:  Diagnosis Date  . ED (erectile dysfunction)   . Environmental allergies   . Hypertension   . Kidney stones   . OSA on CPAP     Current Outpatient Medications on File Prior to Visit  Medication Sig Dispense Refill  . amLODipine (NORVASC) 10 MG tablet Take 10 mg by mouth every morning.    . cetirizine (ZYRTEC) 10 MG tablet Take 10 mg by mouth daily as needed for allergies.    . fexofenadine (ALLEGRA) 180 MG tablet Take 180 mg by mouth daily as needed for allergies or rhinitis.    Marland Kitchen ibuprofen (ADVIL,MOTRIN) 800 MG tablet Take 1 tablet (800 mg total) by mouth 3 (three) times daily. 21 tablet 0  . irbesartan (AVAPRO) 300 MG tablet Take 1 tablet (300 mg total) by mouth daily.  90 tablet 3  . nebivolol (BYSTOLIC) 5 MG tablet Take 1 tablet (5 mg total) by mouth daily. 30 tablet 11   No current facility-administered medications on file prior to visit.     Allergies  Allergen Reactions  . Atenolol Other (See Comments)    fatigue  . Lisinopril Cough  . Triamterene Other (See Comments)    Hot and tired     Assessment/Plan:  1. Hypertension -

## 2018-12-18 DIAGNOSIS — Z0000 Encounter for general adult medical examination without abnormal findings: Secondary | ICD-10-CM | POA: Diagnosis not present

## 2018-12-18 DIAGNOSIS — I10 Essential (primary) hypertension: Secondary | ICD-10-CM | POA: Diagnosis not present

## 2018-12-18 DIAGNOSIS — Z23 Encounter for immunization: Secondary | ICD-10-CM | POA: Diagnosis not present

## 2018-12-18 DIAGNOSIS — R7309 Other abnormal glucose: Secondary | ICD-10-CM | POA: Diagnosis not present

## 2020-02-21 ENCOUNTER — Ambulatory Visit: Payer: Self-pay | Attending: Internal Medicine

## 2020-02-21 DIAGNOSIS — Z23 Encounter for immunization: Secondary | ICD-10-CM

## 2020-02-21 NOTE — Progress Notes (Signed)
   Covid-19 Vaccination Clinic  Name:  Willie Contreras    MRN: [id number] DOB: 07/01/1971  02/21/2020  Willie Contreras was observed post Covid-19 immunization for 15 minutes without incident. He was provided with Vaccine Information Sheet and instruction to access the V-Safe system.   Willie Contreras was instructed to call 911 with any severe reactions post vaccine: Marland Kitchen Difficulty breathing  . Swelling of face and throat  . A fast heartbeat  . A bad rash all over body  . Dizziness and weakness   Immunizations Administered    Name Date Dose VIS Date Route   Pfizer COVID-19 Vaccine 02/21/2020  4:35 PM 0.3 mL 11/23/2019 Intramuscular   Manufacturer: [employer], [employer]   Lot: [id number]   NDC: [address]-3

## 2020-03-17 ENCOUNTER — Ambulatory Visit: Payer: Self-pay | Attending: Internal Medicine

## 2020-03-17 DIAGNOSIS — Z23 Encounter for immunization: Secondary | ICD-10-CM

## 2020-03-17 NOTE — Progress Notes (Signed)
   Covid-19 Vaccination Clinic  Name:  Willie Contreras    MRN: [id number] DOB: August 13, 1971  03/17/2020  [name] was observed post Covid-19 immunization for 15 minutes without incident. He was provided with Vaccine Information Sheet and instruction to access the V-Safe system.   [name] was instructed to call 911 with any severe reactions post vaccine: Marland Kitchen Difficulty breathing  . Swelling of face and throat  . A fast heartbeat  . A bad rash all over body  . Dizziness and weakness   Immunizations Administered    Name Date Dose VIS Date Route   Pfizer COVID-19 Vaccine 03/17/2020  4:21 PM 0.3 mL 11/23/2019 Intramuscular   Manufacturer: [employer], [employer]   Lot: [id number]   NDC: [address]-2

## 2020-07-17 DIAGNOSIS — G4733 Obstructive sleep apnea (adult) (pediatric): Secondary | ICD-10-CM | POA: Diagnosis not present

## 2020-07-17 DIAGNOSIS — R7303 Prediabetes: Secondary | ICD-10-CM | POA: Diagnosis not present

## 2020-07-17 DIAGNOSIS — I10 Essential (primary) hypertension: Secondary | ICD-10-CM | POA: Diagnosis not present

## 2020-11-05 DIAGNOSIS — Z23 Encounter for immunization: Secondary | ICD-10-CM | POA: Diagnosis not present

## 2021-01-09 DIAGNOSIS — R7303 Prediabetes: Secondary | ICD-10-CM | POA: Diagnosis not present

## 2021-01-09 DIAGNOSIS — Z13220 Encounter for screening for lipoid disorders: Secondary | ICD-10-CM | POA: Diagnosis not present

## 2021-01-09 DIAGNOSIS — Z0000 Encounter for general adult medical examination without abnormal findings: Secondary | ICD-10-CM | POA: Diagnosis not present

## 2021-01-09 DIAGNOSIS — E876 Hypokalemia: Secondary | ICD-10-CM | POA: Diagnosis not present

## 2021-01-09 DIAGNOSIS — I10 Essential (primary) hypertension: Secondary | ICD-10-CM | POA: Diagnosis not present

## 2021-02-03 DIAGNOSIS — E876 Hypokalemia: Secondary | ICD-10-CM | POA: Diagnosis not present

## 2021-03-24 DIAGNOSIS — G4733 Obstructive sleep apnea (adult) (pediatric): Secondary | ICD-10-CM | POA: Diagnosis not present

## 2021-03-24 DIAGNOSIS — I10 Essential (primary) hypertension: Secondary | ICD-10-CM | POA: Diagnosis not present

## 2021-03-24 DIAGNOSIS — E669 Obesity, unspecified: Secondary | ICD-10-CM | POA: Diagnosis not present

## 2021-04-06 DIAGNOSIS — G4733 Obstructive sleep apnea (adult) (pediatric): Secondary | ICD-10-CM | POA: Diagnosis not present

## 2021-04-15 DIAGNOSIS — M79601 Pain in right arm: Secondary | ICD-10-CM | POA: Diagnosis not present

## 2021-05-06 DIAGNOSIS — G4733 Obstructive sleep apnea (adult) (pediatric): Secondary | ICD-10-CM | POA: Diagnosis not present

## 2021-05-14 DIAGNOSIS — Z1211 Encounter for screening for malignant neoplasm of colon: Secondary | ICD-10-CM | POA: Diagnosis not present

## 2021-06-06 DIAGNOSIS — G4733 Obstructive sleep apnea (adult) (pediatric): Secondary | ICD-10-CM | POA: Diagnosis not present

## 2021-06-18 DIAGNOSIS — G4733 Obstructive sleep apnea (adult) (pediatric): Secondary | ICD-10-CM | POA: Diagnosis not present

## 2021-06-18 DIAGNOSIS — I10 Essential (primary) hypertension: Secondary | ICD-10-CM | POA: Diagnosis not present

## 2021-07-02 DIAGNOSIS — M25511 Pain in right shoulder: Secondary | ICD-10-CM | POA: Diagnosis not present

## 2021-07-02 DIAGNOSIS — M542 Cervicalgia: Secondary | ICD-10-CM | POA: Diagnosis not present

## 2021-07-06 DIAGNOSIS — G4733 Obstructive sleep apnea (adult) (pediatric): Secondary | ICD-10-CM | POA: Diagnosis not present

## 2021-07-08 DIAGNOSIS — S46011D Strain of muscle(s) and tendon(s) of the rotator cuff of right shoulder, subsequent encounter: Secondary | ICD-10-CM | POA: Diagnosis not present

## 2021-07-08 DIAGNOSIS — M5412 Radiculopathy, cervical region: Secondary | ICD-10-CM | POA: Diagnosis not present

## 2021-07-08 DIAGNOSIS — M6281 Muscle weakness (generalized): Secondary | ICD-10-CM | POA: Diagnosis not present

## 2021-07-16 DIAGNOSIS — M6281 Muscle weakness (generalized): Secondary | ICD-10-CM | POA: Diagnosis not present

## 2021-07-16 DIAGNOSIS — M5412 Radiculopathy, cervical region: Secondary | ICD-10-CM | POA: Diagnosis not present

## 2021-07-16 DIAGNOSIS — S46011D Strain of muscle(s) and tendon(s) of the rotator cuff of right shoulder, subsequent encounter: Secondary | ICD-10-CM | POA: Diagnosis not present

## 2021-07-17 DIAGNOSIS — I10 Essential (primary) hypertension: Secondary | ICD-10-CM | POA: Diagnosis not present

## 2021-07-17 DIAGNOSIS — G4733 Obstructive sleep apnea (adult) (pediatric): Secondary | ICD-10-CM | POA: Diagnosis not present

## 2021-07-17 DIAGNOSIS — R7303 Prediabetes: Secondary | ICD-10-CM | POA: Diagnosis not present

## 2022-01-22 DIAGNOSIS — I10 Essential (primary) hypertension: Secondary | ICD-10-CM | POA: Diagnosis not present

## 2022-01-22 DIAGNOSIS — Z0000 Encounter for general adult medical examination without abnormal findings: Secondary | ICD-10-CM | POA: Diagnosis not present

## 2022-01-22 DIAGNOSIS — R7303 Prediabetes: Secondary | ICD-10-CM | POA: Diagnosis not present

## 2022-01-22 DIAGNOSIS — Z125 Encounter for screening for malignant neoplasm of prostate: Secondary | ICD-10-CM | POA: Diagnosis not present

## 2022-01-28 DIAGNOSIS — M25552 Pain in left hip: Secondary | ICD-10-CM | POA: Diagnosis not present

## 2022-01-28 DIAGNOSIS — M25561 Pain in right knee: Secondary | ICD-10-CM | POA: Diagnosis not present

## 2022-01-28 DIAGNOSIS — M25562 Pain in left knee: Secondary | ICD-10-CM | POA: Diagnosis not present

## 2022-02-25 DIAGNOSIS — M25552 Pain in left hip: Secondary | ICD-10-CM | POA: Diagnosis not present

## 2022-02-25 DIAGNOSIS — M25562 Pain in left knee: Secondary | ICD-10-CM | POA: Diagnosis not present

## 2022-02-25 DIAGNOSIS — M25561 Pain in right knee: Secondary | ICD-10-CM | POA: Diagnosis not present

## 2022-03-01 ENCOUNTER — Ambulatory Visit
Admission: RE | Admit: 2022-03-01 | Discharge: 2022-03-01 | Disposition: A | Discharge status: Home / Self Care | Payer: BC Managed Care – PPO | Source: Ambulatory Visit | Attending: Sports Medicine | Admitting: Sports Medicine

## 2022-03-01 ENCOUNTER — Other Ambulatory Visit: Payer: Self-pay | Admitting: Sports Medicine

## 2022-03-01 DIAGNOSIS — M25562 Pain in left knee: Secondary | ICD-10-CM

## 2022-03-01 DIAGNOSIS — M25561 Pain in right knee: Secondary | ICD-10-CM

## 2022-04-30 DIAGNOSIS — M1712 Unilateral primary osteoarthritis, left knee: Secondary | ICD-10-CM | POA: Diagnosis not present

## 2022-06-22 DIAGNOSIS — G4733 Obstructive sleep apnea (adult) (pediatric): Secondary | ICD-10-CM | POA: Diagnosis not present

## 2022-07-23 DIAGNOSIS — K648 Other hemorrhoids: Secondary | ICD-10-CM | POA: Diagnosis not present

## 2022-07-23 DIAGNOSIS — Z1211 Encounter for screening for malignant neoplasm of colon: Secondary | ICD-10-CM | POA: Diagnosis not present

## 2022-07-23 DIAGNOSIS — K5730 Diverticulosis of large intestine without perforation or abscess without bleeding: Secondary | ICD-10-CM | POA: Diagnosis not present

## 2022-07-23 DIAGNOSIS — K635 Polyp of colon: Secondary | ICD-10-CM | POA: Diagnosis not present

## 2022-07-30 DIAGNOSIS — G4733 Obstructive sleep apnea (adult) (pediatric): Secondary | ICD-10-CM | POA: Diagnosis not present

## 2022-07-30 DIAGNOSIS — R7303 Prediabetes: Secondary | ICD-10-CM | POA: Diagnosis not present

## 2022-07-30 DIAGNOSIS — I10 Essential (primary) hypertension: Secondary | ICD-10-CM | POA: Diagnosis not present

## 2022-10-25 DIAGNOSIS — F411 Generalized anxiety disorder: Secondary | ICD-10-CM | POA: Diagnosis not present

## 2022-11-08 DIAGNOSIS — F411 Generalized anxiety disorder: Secondary | ICD-10-CM | POA: Diagnosis not present

## 2022-11-22 DIAGNOSIS — F411 Generalized anxiety disorder: Secondary | ICD-10-CM | POA: Diagnosis not present

## 2022-11-29 DIAGNOSIS — F411 Generalized anxiety disorder: Secondary | ICD-10-CM | POA: Diagnosis not present

## 2022-12-20 DIAGNOSIS — F4310 Post-traumatic stress disorder, unspecified: Secondary | ICD-10-CM | POA: Diagnosis not present

## 2022-12-27 DIAGNOSIS — F411 Generalized anxiety disorder: Secondary | ICD-10-CM | POA: Diagnosis not present

## 2023-01-24 DIAGNOSIS — F4310 Post-traumatic stress disorder, unspecified: Secondary | ICD-10-CM | POA: Diagnosis not present

## 2023-02-04 DIAGNOSIS — R7303 Prediabetes: Secondary | ICD-10-CM | POA: Diagnosis not present

## 2023-02-04 DIAGNOSIS — I10 Essential (primary) hypertension: Secondary | ICD-10-CM | POA: Diagnosis not present

## 2023-02-04 DIAGNOSIS — Z0000 Encounter for general adult medical examination without abnormal findings: Secondary | ICD-10-CM | POA: Diagnosis not present

## 2023-02-07 DIAGNOSIS — F411 Generalized anxiety disorder: Secondary | ICD-10-CM | POA: Diagnosis not present

## 2023-06-01 DIAGNOSIS — Z23 Encounter for immunization: Secondary | ICD-10-CM | POA: Diagnosis not present

## 2023-08-19 DIAGNOSIS — R7303 Prediabetes: Secondary | ICD-10-CM | POA: Diagnosis not present

## 2023-08-19 DIAGNOSIS — G4733 Obstructive sleep apnea (adult) (pediatric): Secondary | ICD-10-CM | POA: Diagnosis not present

## 2023-08-19 DIAGNOSIS — I10 Essential (primary) hypertension: Secondary | ICD-10-CM | POA: Diagnosis not present

## 2023-08-19 DIAGNOSIS — Z23 Encounter for immunization: Secondary | ICD-10-CM | POA: Diagnosis not present

## 2024-02-24 DIAGNOSIS — R7303 Prediabetes: Secondary | ICD-10-CM | POA: Diagnosis not present

## 2024-02-24 DIAGNOSIS — I10 Essential (primary) hypertension: Secondary | ICD-10-CM | POA: Diagnosis not present

## 2024-02-24 DIAGNOSIS — Z125 Encounter for screening for malignant neoplasm of prostate: Secondary | ICD-10-CM | POA: Diagnosis not present

## 2024-02-24 DIAGNOSIS — Z0000 Encounter for general adult medical examination without abnormal findings: Secondary | ICD-10-CM | POA: Diagnosis not present

## 2024-03-01 DIAGNOSIS — R399 Unspecified symptoms and signs involving the genitourinary system: Secondary | ICD-10-CM | POA: Diagnosis not present

## 2024-03-01 DIAGNOSIS — L918 Other hypertrophic disorders of the skin: Secondary | ICD-10-CM | POA: Diagnosis not present

## 2024-06-26 DIAGNOSIS — G4733 Obstructive sleep apnea (adult) (pediatric): Secondary | ICD-10-CM | POA: Diagnosis not present

## 2024-08-24 DIAGNOSIS — R7303 Prediabetes: Secondary | ICD-10-CM | POA: Diagnosis not present

## 2024-08-24 DIAGNOSIS — R399 Unspecified symptoms and signs involving the genitourinary system: Secondary | ICD-10-CM | POA: Diagnosis not present

## 2024-08-24 DIAGNOSIS — I10 Essential (primary) hypertension: Secondary | ICD-10-CM | POA: Diagnosis not present
